# Patient Record
Sex: Female | Born: 1985 | Hispanic: No | Marital: Single | Smoking: Never smoker
Health system: Southern US, Community
[De-identification: ages and names within clinical notes are randomized; demographics above are authoritative.]

## PROBLEM LIST (undated history)

## (undated) DIAGNOSIS — Z9889 Other specified postprocedural states: Secondary | ICD-10-CM

## (undated) HISTORY — DX: Other specified postprocedural states: Z98.890

## (undated) HISTORY — PX: TYMPANOSTOMY TUBE PLACEMENT: SHX32

---
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
## 2006-03-31 ENCOUNTER — Ambulatory Visit: Payer: Self-pay | Admitting: Internal Medicine

## 2006-10-01 ENCOUNTER — Ambulatory Visit: Payer: Self-pay | Admitting: Internal Medicine

## 2007-07-04 ENCOUNTER — Ambulatory Visit: Payer: Self-pay | Admitting: Internal Medicine

## 2007-09-26 ENCOUNTER — Ambulatory Visit: Payer: Self-pay | Admitting: Internal Medicine

## 2007-09-26 DIAGNOSIS — R51 Headache: Secondary | ICD-10-CM

## 2007-09-26 DIAGNOSIS — R519 Headache, unspecified: Secondary | ICD-10-CM | POA: Insufficient documentation

## 2007-09-26 DIAGNOSIS — G4700 Insomnia, unspecified: Secondary | ICD-10-CM

## 2007-09-26 DIAGNOSIS — J069 Acute upper respiratory infection, unspecified: Secondary | ICD-10-CM | POA: Insufficient documentation

## 2007-09-26 DIAGNOSIS — L708 Other acne: Secondary | ICD-10-CM

## 2007-09-26 DIAGNOSIS — E669 Obesity, unspecified: Secondary | ICD-10-CM | POA: Insufficient documentation

## 2008-06-05 ENCOUNTER — Ambulatory Visit: Payer: Self-pay | Admitting: Internal Medicine

## 2008-06-05 DIAGNOSIS — J029 Acute pharyngitis, unspecified: Secondary | ICD-10-CM | POA: Insufficient documentation

## 2008-06-05 DIAGNOSIS — R5381 Other malaise: Secondary | ICD-10-CM

## 2008-06-05 DIAGNOSIS — R5383 Other fatigue: Secondary | ICD-10-CM

## 2008-06-05 LAB — CONVERTED CEMR LAB: Rapid Strep: NEGATIVE

## 2008-06-19 ENCOUNTER — Ambulatory Visit: Payer: Self-pay | Admitting: Family Medicine

## 2008-06-19 ENCOUNTER — Ambulatory Visit: Payer: Self-pay | Admitting: Internal Medicine

## 2008-06-19 DIAGNOSIS — H6120 Impacted cerumen, unspecified ear: Secondary | ICD-10-CM | POA: Insufficient documentation

## 2009-09-05 ENCOUNTER — Ambulatory Visit: Payer: Self-pay | Admitting: Genetic Counselor

## 2010-02-27 ENCOUNTER — Ambulatory Visit: Payer: Self-pay | Admitting: Family Medicine

## 2010-10-07 NOTE — Assessment & Plan Note (Signed)
Summary: st/nausea/njr   Vital Signs:  Patient profile:   25 year old female Weight:      180 pounds O2 Sat:      97 % Temp:     98.4 degrees F Pulse rate:   76 / minute Pulse rhythm:   regular BP sitting:   104 / 76  (left arm)  Vitals Entered By: Pura Spice, RN (February 27, 2010 3:44 PM) CC: sore throat vomited x 1    History of Present Illness: This 25 year old [place] single female who relates she had vomiting times one this a.m. followed by onset of sore throat for the rest of the day. She relates a history that is occurs when she has had strep throat in the past and her throat is very sore at this time she has no fever GI symptoms have subsided  Allergies: 1)  ! Sulfa  Past History:  Past Medical History: Last updated: 06/05/2008 Headache Acne increase bmi  ? pco    Past Surgical History: Last updated: 09/26/2007 Tubes in ears  Social History: Last updated: 06/05/2008 Single Never Smoked Alcohol use-yes Drug use-no   Regular exercise-yes  Risk Factors: Smoking Status: never (09/26/2007)  Review of Systems      See HPI       review of systems is negative except the present illness, no headache, no cough  Physical Exam  General:  Well-developed,well-nourished,in no acute distress; alert,appropriate and cooperative throughout examination Head:  Normocephalic and atraumatic without obvious abnormalities. No apparent alopecia or balding. Eyes:  No corneal or conjunctival inflammation noted. EOMI. Perrla. Funduscopic exam benign, without hemorrhages, exudates or papilledema. Vision grossly normal. Ears:  cerumen buildup bilaterally unable to visualize TM Nose:  External nasal examination shows no deformity or inflammation. Nasal mucosa are pink and moist without lesions or exudates. Mouth:  pharyngeal erythema.  ,not beefy red Lungs:  Normal respiratory effort, chest expands symmetrically. Lungs are clear to auscultation, no crackles or wheezes. Heart:   Normal rate and regular rhythm. S1 and S2 normal without gallop, murmur, click, rub or other extra sounds.   Impression & Recommendations:  Problem # 1:  PHARYNGITIS, ACUTE (ICD-462) Assessment New  Her updated medication list for this problem includes:    Amoxicillin 500 Mg Caps (Amoxicillin) .Marland Kitchen... 1 morn midafternoon and hs rapid strep negative  Problem # 2:  OBESITY (ICD-278.00) Assessment: Unchanged  Complete Medication List: 1)  Yasmin 28 3-0.03 Mg Tabs (Drospirenone-ethinyl estradiol) .Marland Kitchen.. 1 by mouth once daily 2)  Amoxicillin 500 Mg Caps (Amoxicillin) .Marland Kitchen.. 1 morn midafternoon and hs  Other Orders: Rapid Strep (16109)  Patient Instructions: 1)  diagnosis acute pharyngitis 2)  Rapid strep negative however with the appearance of your  3)  tthroat I am going to treat use with amoxicillin 500 mg 3 times daily for 10 days 4)  If you do not improve do not hesitate to call Dr. plan is for return to the office Prescriptions: AMOXICILLIN 500 MG CAPS (AMOXICILLIN) 1 morn midafternoon and hs  #30 x 0   Entered and Authorized by:   Judithann Sheen MD   Signed by:   Judithann Sheen MD on 02/27/2010   Method used:   Electronically to        [facility]  [phone]* (retail)       [address] [place], [place]  [address]       Ph: [phone] or [phone]  Fax: [phone]   RxID:   [id number]   Laboratory Results  Date/Time Received: February 27, 2010 4:45 PM  Date/Time Reported: February 27, 2010 4:45 PM   Other Tests  Rapid Strep: negative Comments Wynona Canes, CMA  February 27, 2010 4:45 PM

## 2011-02-20 ENCOUNTER — Ambulatory Visit: Payer: Self-pay | Admitting: Internal Medicine

## 2011-08-29 ENCOUNTER — Ambulatory Visit (INDEPENDENT_AMBULATORY_CARE_PROVIDER_SITE_OTHER): Payer: 59

## 2011-08-29 DIAGNOSIS — R05 Cough: Secondary | ICD-10-CM

## 2011-08-29 DIAGNOSIS — J111 Influenza due to unidentified influenza virus with other respiratory manifestations: Secondary | ICD-10-CM

## 2011-08-29 DIAGNOSIS — R509 Fever, unspecified: Secondary | ICD-10-CM

## 2011-09-03 ENCOUNTER — Telehealth: Payer: Self-pay | Admitting: *Deleted

## 2011-09-03 NOTE — Telephone Encounter (Signed)
Per Mom-Pt is having a very bad sore throat. Pt has been on tamiflu since sat. I called pt- pt states that she has a sore throat but it's not bad. She has had it for several days. No fever. Some coughing and congestion. Finished the tamiflu yesterday. I advised pt that she sounds like she is getting better. Just keep resting and drinking fluids. Call if she gets worse.

## 2011-09-30 ENCOUNTER — Ambulatory Visit (INDEPENDENT_AMBULATORY_CARE_PROVIDER_SITE_OTHER): Payer: 59 | Admitting: Family

## 2011-09-30 ENCOUNTER — Encounter: Payer: Self-pay | Admitting: Family

## 2011-09-30 VITALS — BP 120/80 | HR 87 | Temp 97.8°F | Wt 175.0 lb

## 2011-09-30 DIAGNOSIS — J069 Acute upper respiratory infection, unspecified: Secondary | ICD-10-CM

## 2011-09-30 MED ORDER — FEXOFENADINE-PSEUDOEPHED ER 180-240 MG PO TB24: 1.0000 | ORAL_TABLET | Freq: Every day | ORAL | Status: AC

## 2011-09-30 NOTE — Patient Instructions (Signed)

## 2011-09-30 NOTE — Progress Notes (Signed)
  Subjective:    Patient ID: Kristen Pitts, female    DOB: 08-06-1986, 26 y.o.   MRN: [id number]  HPI 26 year old Kristen Pitts female, nonsmoker, patient of Dr. Fabian Sharp is in today with complaint of sneezing, cough, congestion has been going on since last night. She denies any fever, no muscle aches or pain. And is concerned because her mother and hertion.   Review of Systems  Constitutional: Negative.   HENT: Positive for congestion, sneezing, postnasal drip and sinus pressure.   Eyes: Negative.   Respiratory: Negative.   Cardiovascular: Negative.   Gastrointestinal: Negative.   Musculoskeletal: Negative.   Skin: Negative.   Neurological: Negative.   Psychiatric/Behavioral: Negative.    No past medical history on file.  History   Social History  . Marital Status: Single    Spouse Name: N/A    Number of Children: N/A  . Years of Education: N/A   Occupational History  . Not on file.   Social History Main Topics  . Smoking status: Never Smoker   . Smokeless tobacco: Not on file  . Alcohol Use: Not on file  . Drug Use: Not on file  . Sexually Active: Not on file   Other Topics Concern  . Not on file   Social History Narrative  . No narrative on file    No past surgical history on file.  No family history on file.  Allergies  Allergen Reactions  . Sulfonamide Derivatives     No current outpatient prescriptions on file prior to visit.    BP 120/80  Pulse 87  Temp(Src) 97.8 F (36.6 C) (Oral)  Wt 175 lb (79.379 kg)  SpO2 99%  LMP 12/31/2012chart     Objective:   Physical Exam  Constitutional: She is oriented to person, place, and time. She appears well-developed and well-nourished.  HENT:  Right Ear: External ear normal.  Left Ear: External ear normal.  Nose: Nose normal.  Mouth/Throat: Oropharynx is clear and moist.  Neck: Normal range of motion. Neck supple.  Cardiovascular: Normal rate, regular rhythm and normal heart sounds.   Pulmonary/Chest: Effort  normal and breath sounds normal.  Musculoskeletal: Normal range of motion.  Neurological: She is alert and oriented to person, place, and time.  Skin: Skin is warm and dry.  Psychiatric: She has a normal mood and affect.          Assessment & Plan  Assessment: Upper Respiratory Infection  Plan: Allegra D. 24 hours 1 by mouth daily. Rest. Drink plenty of fluids. Call the office if symptoms worsen or persist, recheck as scheduled, and when necessary.

## 2012-02-19 ENCOUNTER — Ambulatory Visit: Payer: Self-pay | Admitting: Internal Medicine

## 2012-02-19 LAB — CBC WITH DIFFERENTIAL/PLATELET
HCT: 35.4 % (ref 35.0–47.0)
HGB: 11.8 g/dL — ABNORMAL LOW (ref 12.0–16.0)
Lymphocyte #: 1.9 10*3/uL (ref 1.0–3.6)
Lymphocyte %: 25.8 %
MCHC: 33.3 g/dL (ref 32.0–36.0)
Monocyte #: 0.5 x10 3/mm (ref 0.2–0.9)
RBC: 4.01 10*6/uL (ref 3.80–5.20)
WBC: 7.4 10*3/uL (ref 3.6–11.0)

## 2012-02-19 LAB — URINALYSIS, COMPLETE
Glucose,UR: NEGATIVE mg/dL (ref 0–75)
Ph: 6 (ref 4.5–8.0)
Specific Gravity: 1.025 (ref 1.003–1.030)

## 2012-02-19 LAB — COMPREHENSIVE METABOLIC PANEL
Alkaline Phosphatase: 53 U/L (ref 50–136)
BUN: 13 mg/dL (ref 7–18)
Bilirubin,Total: 0.3 mg/dL (ref 0.2–1.0)
Calcium, Total: 8.9 mg/dL (ref 8.5–10.1)
Co2: 25 mmol/L (ref 21–32)
EGFR (Non-African Amer.): >60
Glucose: 81 mg/dL (ref 65–99)
Osmolality: 277 (ref 275–301)
SGOT(AST): 15 U/L (ref 15–37)
Total Protein: 7.6 g/dL (ref 6.4–8.2)

## 2012-02-21 LAB — URINE CULTURE

## 2012-06-28 ENCOUNTER — Ambulatory Visit (INDEPENDENT_AMBULATORY_CARE_PROVIDER_SITE_OTHER): Payer: 59

## 2012-06-28 DIAGNOSIS — Z23 Encounter for immunization: Secondary | ICD-10-CM

## 2012-07-14 ENCOUNTER — Encounter: Payer: Self-pay | Admitting: Internal Medicine

## 2012-07-14 ENCOUNTER — Ambulatory Visit (INDEPENDENT_AMBULATORY_CARE_PROVIDER_SITE_OTHER): Payer: 59 | Admitting: Internal Medicine

## 2012-07-14 VITALS — BP 100/60 | HR 75 | Temp 98.5°F | Wt 167.0 lb

## 2012-07-14 DIAGNOSIS — J029 Acute pharyngitis, unspecified: Secondary | ICD-10-CM

## 2012-07-14 DIAGNOSIS — J309 Allergic rhinitis, unspecified: Secondary | ICD-10-CM

## 2012-07-14 MED ORDER — AMOXICILLIN 500 MG PO CAPS: 500.0000 mg | ORAL_CAPSULE | Freq: Three times a day (TID) | ORAL | Status: DC

## 2012-07-14 MED ORDER — FLUTICASONE PROPIONATE 50 MCG/ACT NA SUSP: NASAL | Status: DC

## 2012-07-14 NOTE — Patient Instructions (Signed)
Your symptoms may be from allergy inflammation and allergic sinusitis begin an over-the-counter antihistamine such as Claritin Zyrtec or Allegra and add the nasal cortisone to take 2 sprays in each side of the nose once a day.  If the drainage becomes thick and discolored and not improving or face pain you can add the antibiotic for possible secondary bacterial sinus infection.

## 2012-07-14 NOTE — Progress Notes (Signed)
Chief Complaint  Patient presents with  . Sore Throat    ongoing for a few weeks.  . Nasal Congestion  . Headache    HPI: Patient comes in today for SDA for  new problem evaluation. Onset about 10 - 14 days ago of above sx sore throat off and on pain with swallowing.  stuffy runny nose off and on . Mild cough no fever NVD no face pain but had HA yesterday . Hx of  Fall allergies and  This year taking mucinex  Worse sx if sore throat.  No post nasal drainge   Stuffy. Mouth breath at nightsometimes No fever.  ROS: See pertinent positives and negatives per HPI. Past Medical History  Diagnosis Date  . Hx of tympanostomy tubes     No family history on file. Record not abstracted yet  Data in old EHR  History   Social History  . Marital Status: Single    Spouse Name: N/A    Number of Children: N/A  . Years of Education: N/A   Social History Main Topics  . Smoking status: Never Smoker   . Smokeless tobacco: None  . Alcohol Use: None  . Drug Use: None  . Sexually Active: None   Other Topics Concern  . None   Social History Narrative   [facility] 5 daysNo ets.Pet dog HH of 3-4 at home    Current outpatient prescriptions:ibuprofen (ADVIL,MOTRIN) 200 MG tablet, Take 200 mg by mouth every 6 (six) hours as needed., Disp: , Rfl: ;  Norethindrone Acetate-Ethinyl Estrad-FE (LOESTRIN 24 FE) 1-20 MG-MCG(24) tablet, Take 1 tablet by mouth daily., Disp: , Rfl: ;  amoxicillin (AMOXIL) 500 MG capsule, Take 1 capsule (500 mg total) by mouth 3 (three) times daily. For sinusitis, Disp: 30 capsule, Rfl: 0 fexofenadine-pseudoephedrine (ALLEGRA-D 24) 180-240 MG per 24 hr tablet, Take 1 tablet by mouth daily., Disp: 30 tablet, Rfl: 2;  fluticasone (FLONASE) 50 MCG/ACT nasal spray, 2 sprays each nostril q d, Disp: 16 g, Rfl: 3  EXAM: BP 100/60  Pulse 75  Temp 98.5 F (36.9 C) (Oral)  Wt 167 lb (75.751 kg)  SpO2 98%  LMP 07/08/2012 GENERAL: vitals reviewed and listed  above, alert, oriented, appears well hydrated and in no acute distress  HEENT: Normocephalic ;atraumatic , Eyes;  PERRL, EOMs  Full, lids and conjunctiva clear,,Ears: no deformities, canals nl, TM landmarks normal, Nose: no deformity or discharge  Congested face non tender  Mouth : OP clear without lesion or edema .tonsil 1+ no redness pos cobblestoning  NECK: no obvious masses on inspection palpation shoddy ac nodes   LUNGS: clear to auscultation bilaterally, no wheezes, rales or rhonchi, good air movement  CV: HRRR, no clubbing cyanosis or  peripheral edema nl cap refill   MS: moves all extremities without noticeable focal  abnormality  PSYCH: pleasant and cooperative, no obvious depression or anxiety  ASSESSMENT AND PLAN:  Discussed the following assessment and plan:  1. Sore throat    poss from drainage and ur congestion allergic vs infection  2. Allergic sinusitis    seasonal     -Patient advised to return or notify a doctor immediately if symptoms worsen or persist or new concerns arise.  Patient Instructions  Your symptoms may be from allergy inflammation and allergic sinusitis begin an over-the-counter antihistamine such as Claritin Zyrtec or Allegra and add the nasal cortisone to take 2 sprays in each side of the nose once a day.  If the drainage becomes thick and discolored and not improving or face pain you can add the antibiotic for possible secondary bacterial sinus infection.      Lorretta Harp

## 2012-12-21 ENCOUNTER — Encounter: Payer: Self-pay | Admitting: Internal Medicine

## 2012-12-21 ENCOUNTER — Ambulatory Visit (INDEPENDENT_AMBULATORY_CARE_PROVIDER_SITE_OTHER): Payer: BC Managed Care – PPO | Admitting: Internal Medicine

## 2012-12-21 VITALS — BP 144/84 | HR 96 | Temp 98.2°F | Wt 165.0 lb

## 2012-12-21 DIAGNOSIS — J042 Acute laryngotracheitis: Secondary | ICD-10-CM

## 2012-12-21 DIAGNOSIS — J069 Acute upper respiratory infection, unspecified: Secondary | ICD-10-CM

## 2012-12-21 DIAGNOSIS — J309 Allergic rhinitis, unspecified: Secondary | ICD-10-CM

## 2012-12-21 MED ORDER — HYDROCODONE-HOMATROPINE 5-1.5 MG/5ML PO SYRP: 5.0000 mL | ORAL_SOLUTION | ORAL | Status: DC | PRN

## 2012-12-21 MED ORDER — AMOXICILLIN 500 MG PO CAPS: 500.0000 mg | ORAL_CAPSULE | Freq: Three times a day (TID) | ORAL | Status: DC

## 2012-12-21 NOTE — Progress Notes (Signed)
Chief Complaint  Patient presents with  . Hoarse    Cough is dry.  Was in [place] last week.  . Cough  . Sore Throat    HPI: Patient comes in today for SDA for  new problem evaluation. 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  Tight some in chest.  Heavy. No sob  When not coughing  Tried    cepacol   Not help and cough drops.  Gone through whole box .   ROS: See pertinent positives and negatives per HPI. No nvd sob new skin rashes some watery eyes at time s  Past Medical History  Diagnosis Date  . Hx of tympanostomy tubes     No family history on file.  History   Social History  . Marital Status: Single    Spouse Name: N/A    Number of Children: N/A  . Years of Education: N/A   Social History Main Topics  . Smoking status: Never Smoker   . Smokeless tobacco: None  . Alcohol Use: None  . Drug Use: None  . Sexually Active: None   Other Topics Concern  . None   Social History Narrative   [facility] visitor 5 days   No ets.   Pet dog    HH of 3-4 at home    Outpatient Encounter Prescriptions as of 12/21/2012  Medication Sig Dispense Refill  . Arginine 1000 MG TABS Take by mouth.      . cetirizine (ZYRTEC) 10 MG tablet Take 10 mg by mouth daily.      . fluticasone (FLONASE) 50 MCG/ACT nasal spray 2 sprays each nostril q d  16 g  3  . ibuprofen (ADVIL,MOTRIN) 200 MG tablet Take 200 mg by mouth every 6 (six) hours as needed.      . Norethin Ace-Eth Estrad-FE (MINASTRIN 24 FE PO) Take by mouth.      . [DISCONTINUED] Norethindrone Acetate-Ethinyl Estrad-FE (LOESTRIN 24 FE) 1-20 MG-MCG(24) tablet Take 1 tablet by mouth daily.      Marland Kitchen amoxicillin (AMOXIL) 500 MG capsule Take 1 capsule (500 mg total) by mouth 3 (three) times daily. For sinusitis  30 capsule  0  . HYDROcodone-homatropine  (HYCODAN) 5-1.5 MG/5ML syrup Take 5 mLs by mouth every 4 (four) hours as needed for cough.  180 mL  0  . [DISCONTINUED] amoxicillin (AMOXIL) 500 MG capsule Take 1 capsule (500 mg total) by mouth 3 (three) times daily. For sinusitis  30 capsule  0   No facility-administered encounter medications on file as of 12/21/2012.    EXAM:  BP 144/84  Pulse 96  Temp(Src) 98.2 F (36.8 C) (Oral)  Wt 165 lb (74.844 kg)  SpO2 97%  LMP 12/20/2012  There is no height on file to calculate BMI.  WDWN in NAD  quiet respirations; mildly congested  Somewhat moderatly  hoarse. Non toxic . nostridor  HEENT: Normocephalic ;atraumatic , Eyes;  PERRL, EOMs  Full, lids and conjunctiva clear,,Ears: no deformities, canals nl, TM landmarks normal, Nose: no deformity mucoid discharge  but congested;face minimally tender on r maxilla  Mouth : OP clear without lesion or edema . Mild reythema  Neck: Supple without adenopathy or masses or bruits  Chest:  Clear to A&P without wheezes rales or rhonchi CV:  S1-S2 no gallops or murmurs peripheral perfusion is normal Skin :nl perfusion and no acute rashes  PSYCH: pleasant and cooperative, no obvious depression or anxiety  ASSESSMENT AND PLAN:  Discussed the following assessment and plan:  Acute viral laryngotracheitis  URI, acute  Allergic rhinitis Expectant management decongestants nasal steroids saline cough suppression at night  vaporizer warm liquids   if facial pressure not improving add antibiotic for secondary infection and she is a week into her symptoms. -Patient advised to return or notify health care team  if symptoms worsen or persist or new concerns arise.  Patient Instructions  This is still probably a viral laryngitis and respiratory infection with allergy also.   Can use cough medicine for comfort relative voice rest hot liquids such as tea caffeinated tea and honey may be just as good as medication limit cough drops and change to sugar-free lemon  drops or candy.   Expect cough to last for another week or so but facial pressure should get better in the next few days. If not happening and nasal congestion looks more infected you can add the antibiotic.  Your lungs are clear today there are no signs of pneumonia. Can take flonase or otc nasacort nasal saline wash decongestants such as sudafed in the day .     Neta Mends. Levy Wellman M.D.

## 2012-12-21 NOTE — Patient Instructions (Addendum)
This is still probably a viral laryngitis and respiratory infection with allergy also.   Can use cough medicine for comfort relative voice rest hot liquids such as tea caffeinated tea and honey may be just as good as medication limit cough drops and change to sugar-free lemon drops or candy.   Expect cough to last for another week or so but facial pressure should get better in the next few days. If not happening and nasal congestion looks more infected you can add the antibiotic.  Your lungs are clear today there are no signs of pneumonia. Can take flonase or otc nasacort nasal saline wash decongestants such as sudafed in the day .

## 2013-01-16 IMAGING — CR DG CHEST 2V
1 series · 2 of 2 positions shown · non-contrast
Comparison: none

REASON FOR EXAM: mild dyspnea, R side pain, on OCP
COMMENTS:

PROCEDURE:     MDR - MDR CHEST PA(OR AP) AND LATERAL  - February 19, 2012  [DATE]
RESULT:     The lungs are clear. The cardiac silhouette and visualized bony
skeleton are unremarkable.

[Series 1: pa · 0.17mm/px · 2 of 2 slices shown]
[im 1/2]
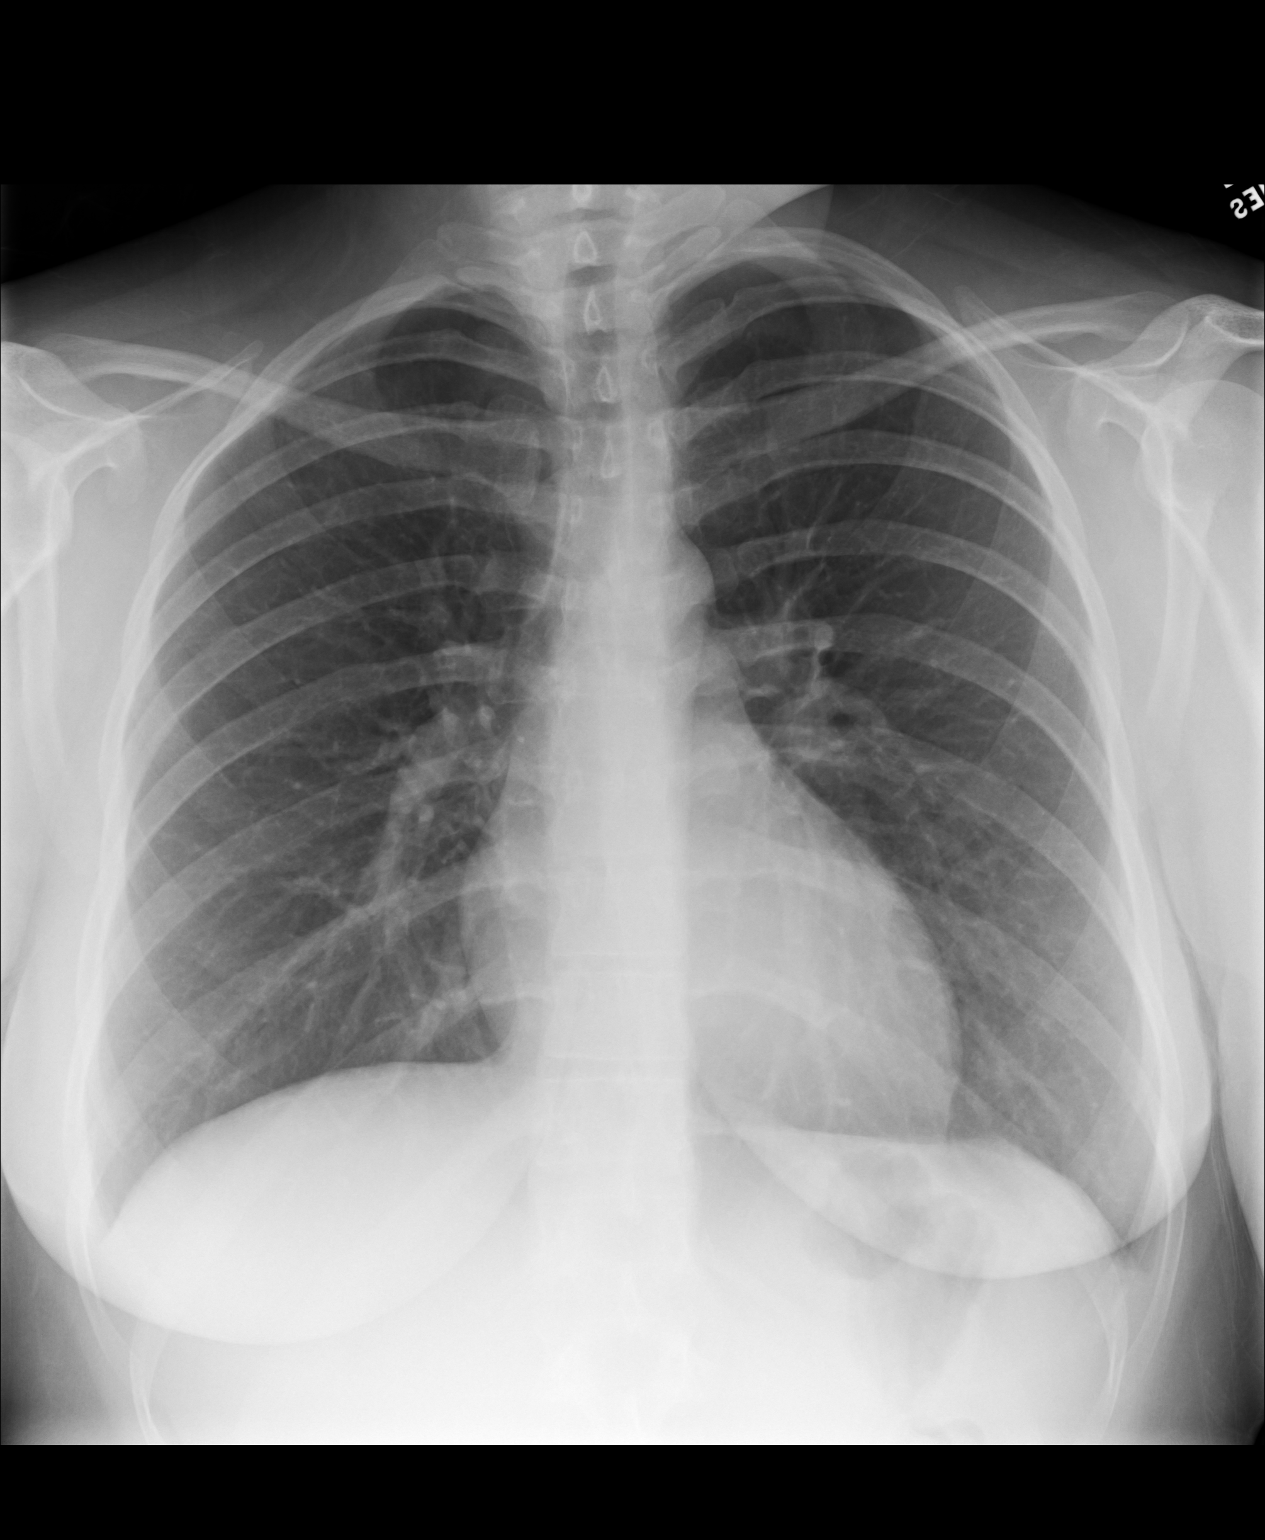
[im 2/2]
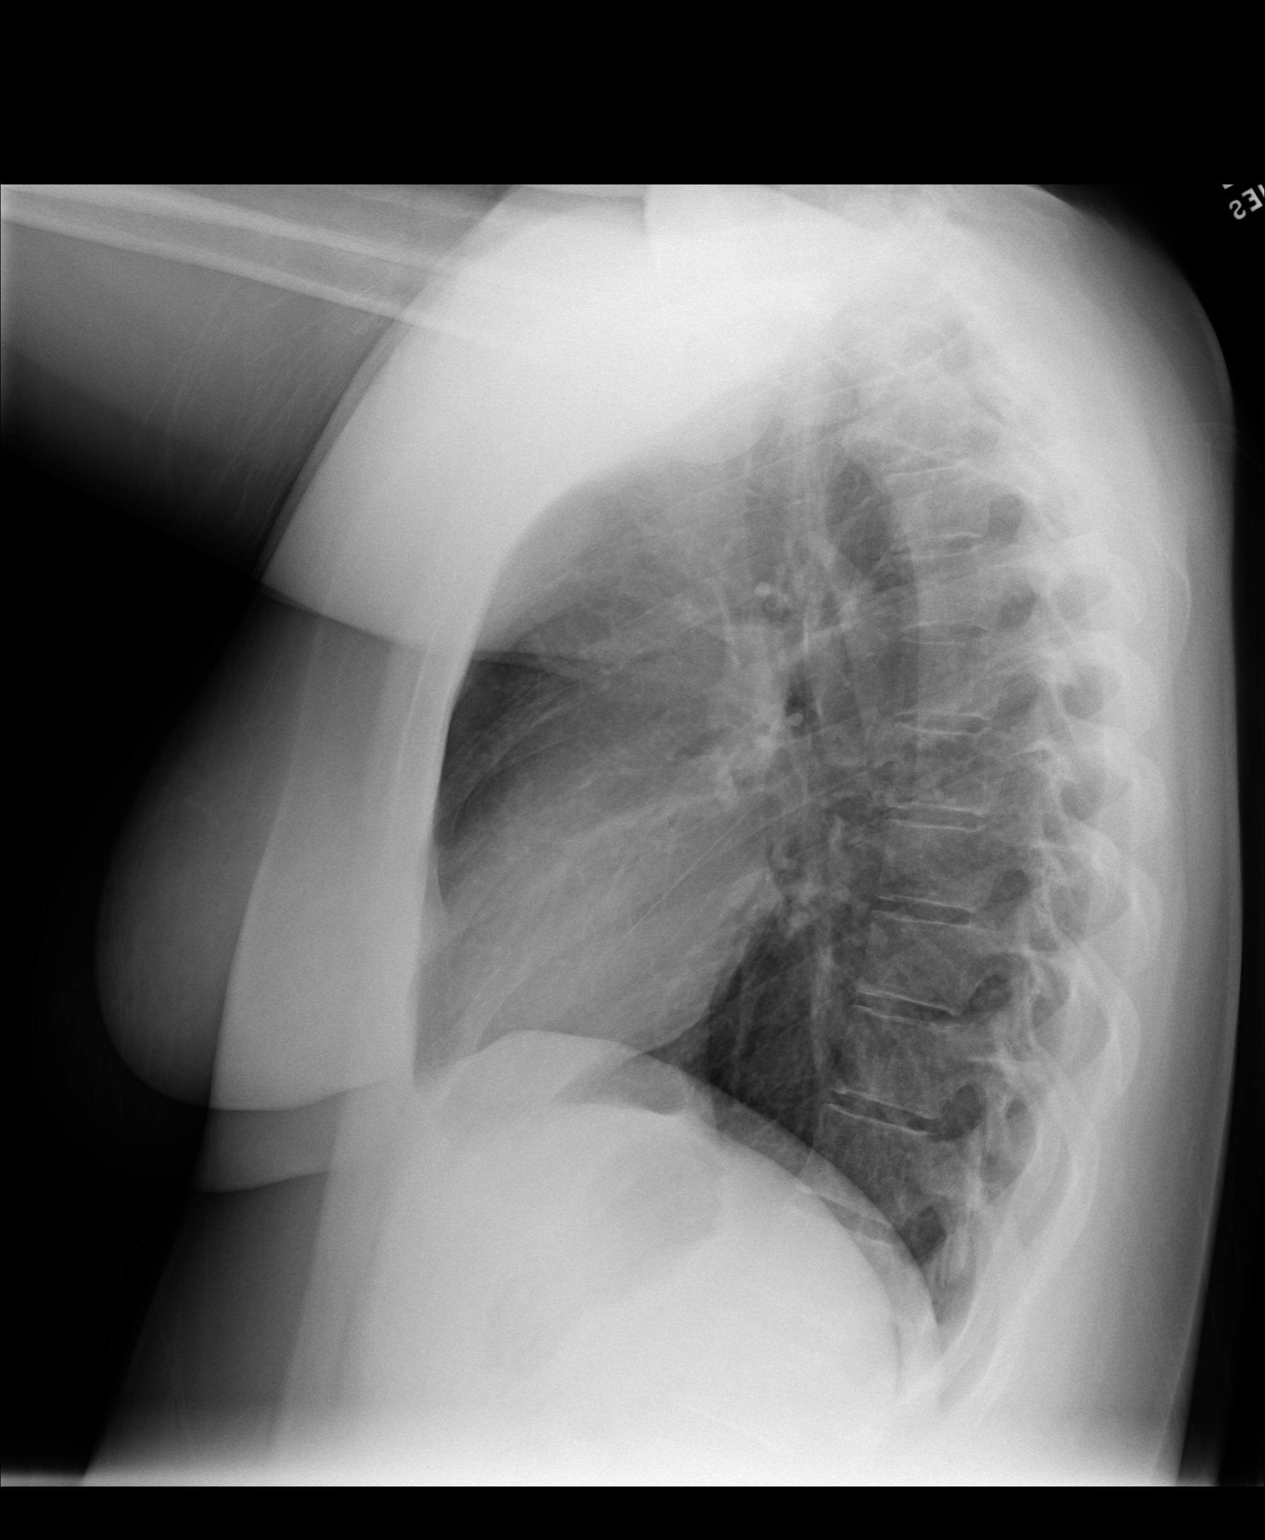

[2 of 2 positions shown; findings below may reference images not displayed]

IMPRESSION: 1. Chest radiograph without evidence of acute cardiopulmonary disease.

## 2014-02-06 ENCOUNTER — Telehealth: Payer: Self-pay | Admitting: Internal Medicine

## 2014-02-06 NOTE — Telephone Encounter (Signed)
Pt called to ask  if her daughter has had the HPV vaccine she would like a call back

## 2014-02-07 NOTE — Telephone Encounter (Signed)
No DPR on file.  I cannot speak to the mother.

## 2014-03-26 ENCOUNTER — Encounter: Payer: Self-pay | Admitting: Internal Medicine

## 2014-03-26 ENCOUNTER — Ambulatory Visit (INDEPENDENT_AMBULATORY_CARE_PROVIDER_SITE_OTHER): Payer: 59 | Admitting: Internal Medicine

## 2014-03-26 VITALS — BP 110/74 | HR 93 | Temp 98.1°F | Ht 64.0 in | Wt 156.4 lb

## 2014-03-26 DIAGNOSIS — H6120 Impacted cerumen, unspecified ear: Secondary | ICD-10-CM

## 2014-03-26 DIAGNOSIS — H6122 Impacted cerumen, left ear: Secondary | ICD-10-CM

## 2014-03-26 NOTE — Progress Notes (Signed)
   Subjective:    Patient ID: Kristen BrighamDana K Pitts, female    DOB: 19-Jun-1986, 28 y.o.   MRN: [id number]  HPI  C/o L ear being clogged x 1 eek  Review of Systems  Constitutional: Negative for fever and chills.  HENT: Positive for hearing loss. Negative for congestion, ear discharge and ear pain.        Objective:   Physical Exam  HENT:  Head: Normocephalic.  Right Ear: External ear normal.  Mouth/Throat: Oropharynx is clear and moist. No oropharyngeal exudate.  L ear wax     Procedure Note :     Procedure :  Ear irrigation   Indication:  Cerumen impaction L   Risks, including pain, dizziness, eardrum perforation, bleeding, infection and others as well as benefits were explained to the patient in detail. Verbal consent was obtained and the patient agreed to proceed.    We used "The Elephant Ear Irrigation Device" filled with lukewarm water for irrigation. A large amount wax was recovered. Procedure has also required manual wax removal with an ear loop.   Tolerated well. Complications: None.   Postprocedure instructions :  Call if problems.        Assessment & Plan:

## 2014-03-26 NOTE — Assessment & Plan Note (Signed)
Will irrigate 

## 2014-03-26 NOTE — Progress Notes (Signed)
Pre visit review using our clinic review tool, if applicable. No additional management support is needed unless otherwise documented below in the visit note. 

## 2014-04-24 NOTE — Telephone Encounter (Signed)
Pt.notified

## 2014-06-06 ENCOUNTER — Ambulatory Visit (INDEPENDENT_AMBULATORY_CARE_PROVIDER_SITE_OTHER): Payer: 59

## 2014-06-06 DIAGNOSIS — Z23 Encounter for immunization: Secondary | ICD-10-CM

## 2015-06-28 ENCOUNTER — Ambulatory Visit (INDEPENDENT_AMBULATORY_CARE_PROVIDER_SITE_OTHER): Payer: PRIVATE HEALTH INSURANCE | Admitting: Family Medicine

## 2015-06-28 DIAGNOSIS — Z23 Encounter for immunization: Secondary | ICD-10-CM | POA: Diagnosis not present

## 2015-11-25 ENCOUNTER — Encounter: Payer: PRIVATE HEALTH INSURANCE | Admitting: Internal Medicine

## 2016-01-22 ENCOUNTER — Encounter: Payer: PRIVATE HEALTH INSURANCE | Admitting: Internal Medicine

## 2016-09-17 NOTE — Progress Notes (Signed)
Pre visit review using our clinic review tool, if applicable. No additional management support is needed unless otherwise documented below in the visit note.  Chief Complaint  Patient presents with  . Cough    X1wk.  Treating w.Mucinex DM  . Nasal Congestion  . Sinus Pain/Pressure  . Headache    HPI: Kristen Pitts 31 y.o.  sda    8 days ago under weather and the cough could sx .   And rhinorrhea  And now   More congestion and pressure   Mucous yellow some .    Trying mucix  Dm  No fever cp sob tight in mid chest   ROS: See pertinent positives and negatives per HPI. No fever chills   Past Medical History:  Diagnosis Date  . Hx of tympanostomy tubes     No family history on file.  Social History   Social History  . Marital status: Single    Spouse name: N/A  . Number of children: N/A  . Years of education: N/A   Social History Main Topics  . Smoking status: Never Smoker  . Smokeless tobacco: None  . Alcohol use None  . Drug use: Unknown  . Sexual activity: Not Asked   Other Topics Concern  . None   Social History Narrative   [facility] visitor 5 days   No ets.   Pet dog    HH of 3-4 at home    Outpatient Medications Prior to Visit  Medication Sig Dispense Refill  . ibuprofen (ADVIL,MOTRIN) 200 MG tablet Take 200 mg by mouth every 6 (six) hours as needed.    . Norethin Ace-Eth Estrad-FE (MINASTRIN 24 FE PO) Take by mouth.    . cetirizine (ZYRTEC) 10 MG tablet Take 10 mg by mouth daily.    . fluticasone (FLONASE) 50 MCG/ACT nasal spray 2 sprays each nostril q d 16 g 3   No facility-administered medications prior to visit.      EXAM:  BP 134/76 (BP Location: Right Arm, Patient Position: Sitting, Cuff Size: Normal)   Temp 98.1 F (36.7 C) (Oral)   Wt 183 lb (83 kg)   BMI 31.41 kg/m   Body mass index is 31.41 kg/m. WDWN in NAD  quiet respirations; moderatly congested  somewhat hoarse. Non toxic . HEENT: Normocephalic ;atraumatic , Eyes;   PERRL, EOMs  Full, lids and conjunctiva clear,,Ears: no deformities, canals nl, TM landmarks normal, Nose: no deformity or discharge but congested;face minimally tender Mouth : OP clear without lesion or edema . Red  Neck: Supple without adenopathy or masses or bruits Chest:  Clear to &P without wheezes rales or rhonchi CV:  S1-S2 no gallops or murmurs peripheral perfusion is normal Skin :nl perfusion and no acute rashes   ASSESSMENT AND PLAN:  Discussed the following assessment and plan:  Viral upper respiratory tract infection with cough  Viral sinusitis  Encounter for immunization - Plan: Flu Vaccine QUAD 36+ mos IM   Expectant management. conatact  As planned   consideration of adding antibiotic  -Patient advised to return or notify health care team  if symptoms worsen ,persist or new concerns arise.  Patient Instructions  This acts like a viral respiratory infection involving the sinuses and still I think will resolve on its and if we add  Nasal saline spray  Over-the-counter decongestant such as Sudafed or similar during the day and for 3 nights generic Afrin nose spray decongestant to help with the sinus  pressure.  Do not use Afrin nose spray more than 3 nights in arise.  Okay to take the Mucinex DM for cough if it helps.  Warm liquids warm compresses   okay for flu shot today takes 2 weeks to  get immunity from the flu shot  If you're at the two-week mark and not improving contact us consideration of antibiotics to help  If you get either chest pain significant shortness of breath seek care  Can use the messaging on my chart for nonemergent questions or needs.     Sinusitis, Adult Sinusitis is soreness and inflammation of your sinuses. Sinuses are hollow spaces in the bones around your face. Your sinuses are located:  Around your eyes.  In the middle of your forehead.  Behind your nose.  In your cheekbones. Your sinuses and nasal passages are lined with a  stringy fluid (mucus). Mucus normally drains out of your sinuses. When your nasal tissues become inflamed or swollen, the mucus can become trapped or blocked so air cannot flow through your sinuses. This allows bacteria, viruses, and funguses to grow, which leads to infection. Sinusitis can develop quickly and last for 7?10 days (acute) or for more than 12 weeks (chronic). Sinusitis often develops after a cold. What are the causes? This condition is caused by anything that creates swelling in the sinuses or stops mucus from draining, including:  Allergies.  Asthma.  Bacterial or viral infection.  Abnormally shaped bones between the nasal passages.  Nasal growths that contain mucus (nasal polyps).  Narrow sinus openings.  Pollutants, such as chemicals or irritants in the air.  A foreign object stuck in the nose.  A fungal infection. This is rare. What increases the risk? The following factors may make you more likely to develop this condition:  Having allergies or asthma.  Having had a recent cold or respiratory tract infection.  Having structural deformities or blockages in your nose or sinuses.  Having a weak immune system.  Doing a lot of swimming or diving.  Overusing nasal sprays.  Smoking. What are the signs or symptoms? The main symptoms of this condition are pain and a feeling of pressure around the affected sinuses. Other symptoms include:  Upper toothache.  Earache.  Headache.  Bad breath.  Decreased sense of smell and taste.  A cough that may get worse at night.  Fatigue.  Fever.  Thick drainage from your nose. The drainage is often green and it may contain pus (purulent).  Stuffy nose or congestion.  Postnasal drip. This is when extra mucus collects in the throat or back of the nose.  Swelling and warmth over the affected sinuses.  Sore throat.  Sensitivity to light. How is this diagnosed? This condition is diagnosed based on symptoms, a  medical history, and a physical exam. To find out if your condition is acute or chronic, your health care provider may:  Look in your nose for signs of nasal polyps.  Tap over the affected sinus to check for signs of infection.  View the inside of your sinuses using an imaging device that has a light attached (endoscope). If your health care provider suspects that you have chronic sinusitis, you may also:  Be tested for allergies.  Have a sample of mucus taken from your nose (nasal culture) and checked for bacteria.  Have a mucus sample examined to see if your sinusitis is related to an allergy. If your sinusitis does not respond to treatment and it lasts longer  than 8 weeks, you may have an MRI or CT scan to check your sinuses. These scans also help to determine how severe your infection is. In rare cases, a bone biopsy may be done to rule out more serious types of fungal sinus disease. How is this treated? Treatment for sinusitis depends on the cause and whether your condition is chronic or acute. If a virus is causing your sinusitis, your symptoms will go away on their own within 10 days. You may be given medicines to relieve your symptoms, including:  Topical nasal decongestants. They shrink swollen nasal passages and let mucus drain from your sinuses.  Antihistamines. These drugs block inflammation that is triggered by allergies. This can help to ease swelling in your nose and sinuses.  Topical nasal corticosteroids. These are nasal sprays that ease inflammation and swelling in your nose and sinuses.  Nasal saline washes. These rinses can help to get rid of thick mucus in your nose. If your condition is caused by bacteria, you will be given an antibiotic medicine. If your condition is caused by a fungus, you will be given an antifungal medicine. Surgery may be needed to correct underlying conditions, such as narrow nasal passages. Surgery may also be needed to remove polyps. Follow  these instructions at home: Medicines  Take, use, or apply over-the-counter and prescription medicines only as told by your health care provider. These may include nasal sprays.  If you were prescribed an antibiotic medicine, take it as told by your health care provider. Do not stop taking the antibiotic even if you start to feel better. Hydrate and Humidify  Drink enough water to keep your urine clear or pale yellow. Staying hydrated will help to thin your mucus.  Use a cool mist humidifier to keep the humidity level in your home above 50%.  Inhale steam for 10-15 minutes, 3-4 times a day or as told by your health care provider. You can do this in the bathroom while a hot shower is running.  Limit your exposure to cool or dry air. Rest  Rest as much as possible.  Sleep with your head raised (elevated).  Make sure to get enough sleep each night. General instructions  Apply a warm, moist washcloth to your face 3-4 times a day or as told by your health care provider. This will help with discomfort.  Wash your hands often with soap and water to reduce your exposure to viruses and other germs. If soap and water are not available, use hand sanitizer.  Do not smoke. Avoid being around people who are smoking (secondhand smoke).  Keep all follow-up visits as told by your health care provider. This is important. Contact a health care provider if:  You have a fever.  Your symptoms get worse.  Your symptoms do not improve within 10 days. Get help right away if:  You have a severe headache.  You have persistent vomiting.  You have pain or swelling around your face or eyes.  You have vision problems.  You develop confusion.  Your neck is stiff.  You have trouble breathing. This information is not intended to replace advice given to you by your health care provider. Make sure you discuss any questions you have with your health care provider. Document Released: 08/24/2005  Document Revised: 04/19/2016 Document Reviewed: 06/19/2015 Elsevier Interactive Patient Education  2017 [employer].     [place]. Panosh M.D.

## 2016-09-18 ENCOUNTER — Ambulatory Visit (INDEPENDENT_AMBULATORY_CARE_PROVIDER_SITE_OTHER): Payer: PRIVATE HEALTH INSURANCE | Admitting: Internal Medicine

## 2016-09-18 ENCOUNTER — Encounter: Payer: Self-pay | Admitting: Internal Medicine

## 2016-09-18 VITALS — BP 134/76 | Temp 98.1°F | Wt 183.0 lb

## 2016-09-18 DIAGNOSIS — J329 Chronic sinusitis, unspecified: Secondary | ICD-10-CM | POA: Diagnosis not present

## 2016-09-18 DIAGNOSIS — J069 Acute upper respiratory infection, unspecified: Secondary | ICD-10-CM | POA: Diagnosis not present

## 2016-09-18 DIAGNOSIS — B9789 Other viral agents as the cause of diseases classified elsewhere: Secondary | ICD-10-CM | POA: Diagnosis not present

## 2016-09-18 DIAGNOSIS — Z23 Encounter for immunization: Secondary | ICD-10-CM

## 2016-09-18 NOTE — Patient Instructions (Signed)
This acts like a viral respiratory infection involving the sinuses and still I think will resolve on its and if we add  Nasal saline spray  Over-the-counter decongestant such as Sudafed or similar during the day and for 3 nights generic Afrin nose spray decongestant to help with the sinus pressure.  Do not use Afrin nose spray more than 3 nights in arise.  Okay to take the Mucinex DM for cough if it helps.  Warm liquids warm compresses   okay for flu shot today takes 2 weeks to  get immunity from the flu shot  If you're at the two-week mark and not improving contact us consideration of antibiotics to help  If you get either chest pain significant shortness of breath seek care  Can use the messaging on my chart for nonemergent questions or needs.     Sinusitis, Adult Sinusitis is soreness and inflammation of your sinuses. Sinuses are hollow spaces in the bones around your face. Your sinuses are located:  Around your eyes.  In the middle of your forehead.  Behind your nose.  In your cheekbones. Your sinuses and nasal passages are lined with a stringy fluid (mucus). Mucus normally drains out of your sinuses. When your nasal tissues become inflamed or swollen, the mucus can become trapped or blocked so air cannot flow through your sinuses. This allows bacteria, viruses, and funguses to grow, which leads to infection. Sinusitis can develop quickly and last for 7?10 days (acute) or for more than 12 weeks (chronic). Sinusitis often develops after a cold. What are the causes? This condition is caused by anything that creates swelling in the sinuses or stops mucus from draining, including:  Allergies.  Asthma.  Bacterial or viral infection.  Abnormally shaped bones between the nasal passages.  Nasal growths that contain mucus (nasal polyps).  Narrow sinus openings.  Pollutants, such as chemicals or irritants in the air.  A foreign object stuck in the nose.  A fungal  infection. This is rare. What increases the risk? The following factors may make you more likely to develop this condition:  Having allergies or asthma.  Having had a recent cold or respiratory tract infection.  Having structural deformities or blockages in your nose or sinuses.  Having a weak immune system.  Doing a lot of swimming or diving.  Overusing nasal sprays.  Smoking. What are the signs or symptoms? The main symptoms of this condition are pain and a feeling of pressure around the affected sinuses. Other symptoms include:  Upper toothache.  Earache.  Headache.  Bad breath.  Decreased sense of smell and taste.  A cough that may get worse at night.  Fatigue.  Fever.  Thick drainage from your nose. The drainage is often green and it may contain pus (purulent).  Stuffy nose or congestion.  Postnasal drip. This is when extra mucus collects in the throat or back of the nose.  Swelling and warmth over the affected sinuses.  Sore throat.  Sensitivity to light. How is this diagnosed? This condition is diagnosed based on symptoms, a medical history, and a physical exam. To find out if your condition is acute or chronic, your health care provider may:  Look in your nose for signs of nasal polyps.  Tap over the affected sinus to check for signs of infection.  View the inside of your sinuses using an imaging device that has a light attached (endoscope). If your health care provider suspects that you have chronic sinusitis, you may  also:  Be tested for allergies.  Have a sample of mucus taken from your nose (nasal culture) and checked for bacteria.  Have a mucus sample examined to see if your sinusitis is related to an allergy. If your sinusitis does not respond to treatment and it lasts longer than 8 weeks, you may have an MRI or CT scan to check your sinuses. These scans also help to determine how severe your infection is. In rare cases, a bone biopsy may  be done to rule out more serious types of fungal sinus disease. How is this treated? Treatment for sinusitis depends on the cause and whether your condition is chronic or acute. If a virus is causing your sinusitis, your symptoms will go away on their own within 10 days. You may be given medicines to relieve your symptoms, including:  Topical nasal decongestants. They shrink swollen nasal passages and let mucus drain from your sinuses.  Antihistamines. These drugs block inflammation that is triggered by allergies. This can help to ease swelling in your nose and sinuses.  Topical nasal corticosteroids. These are nasal sprays that ease inflammation and swelling in your nose and sinuses.  Nasal saline washes. These rinses can help to get rid of thick mucus in your nose. If your condition is caused by bacteria, you will be given an antibiotic medicine. If your condition is caused by a fungus, you will be given an antifungal medicine. Surgery may be needed to correct underlying conditions, such as narrow nasal passages. Surgery may also be needed to remove polyps. Follow these instructions at home: Medicines  Take, use, or apply over-the-counter and prescription medicines only as told by your health care provider. These may include nasal sprays.  If you were prescribed an antibiotic medicine, take it as told by your health care provider. Do not stop taking the antibiotic even if you start to feel better. Hydrate and Humidify  Drink enough water to keep your urine clear or pale yellow. Staying hydrated will help to thin your mucus.  Use a cool mist humidifier to keep the humidity level in your home above 50%.  Inhale steam for 10-15 minutes, 3-4 times a day or as told by your health care provider. You can do this in the bathroom while a hot shower is running.  Limit your exposure to cool or dry air. Rest  Rest as much as possible.  Sleep with your head raised (elevated).  Make sure to get  enough sleep each night. General instructions  Apply a warm, moist washcloth to your face 3-4 times a day or as told by your health care provider. This will help with discomfort.  Wash your hands often with soap and water to reduce your exposure to viruses and other germs. If soap and water are not available, use hand sanitizer.  Do not smoke. Avoid being around people who are smoking (secondhand smoke).  Keep all follow-up visits as told by your health care provider. This is important. Contact a health care provider if:  You have a fever.  Your symptoms get worse.  Your symptoms do not improve within 10 days. Get help right away if:  You have a severe headache.  You have persistent vomiting.  You have pain or swelling around your face or eyes.  You have vision problems.  You develop confusion.  Your neck is stiff.  You have trouble breathing. This information is not intended to replace advice given to you by your health care provider. Make sure  you discuss any questions you have with your health care provider. Document Released: 08/24/2005 Document Revised: 04/19/2016 Document Reviewed: 06/19/2015 Elsevier Interactive Patient Education  2017 [employer].
